# Patient Record
Sex: Male | Born: 1985 | Race: White | Hispanic: No | Marital: Married | Smoking: Never smoker
Health system: Southern US, Community
[De-identification: ages and names within clinical notes are randomized; demographics above are authoritative.]

## PROBLEM LIST (undated history)

## (undated) HISTORY — PX: NO PAST SURGERIES: SHX2092

---
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
## 2007-10-03 ENCOUNTER — Emergency Department: Payer: Self-pay | Admitting: Emergency Medicine

## 2010-02-06 ENCOUNTER — Emergency Department: Payer: Self-pay | Admitting: Emergency Medicine

## 2010-02-08 ENCOUNTER — Ambulatory Visit: Payer: Self-pay | Admitting: Internal Medicine

## 2011-06-30 IMAGING — CR DG CHEST 2V
1 series · 2 of 2 positions shown · non-contrast
Comparison: none

REASON FOR EXAM: chest and back pain with cough and deep breath x 3 weeks.
COMMENTS:

PROCEDURE:     DXR - DXR CHEST PA (OR AP) AND LATERAL  - February 06, 2010 [DATE]
RESULT:     Comparison: None.

[Series 1: view not recorded · 0.17mm/px · 2 of 2 slices shown]
[im 1/2]
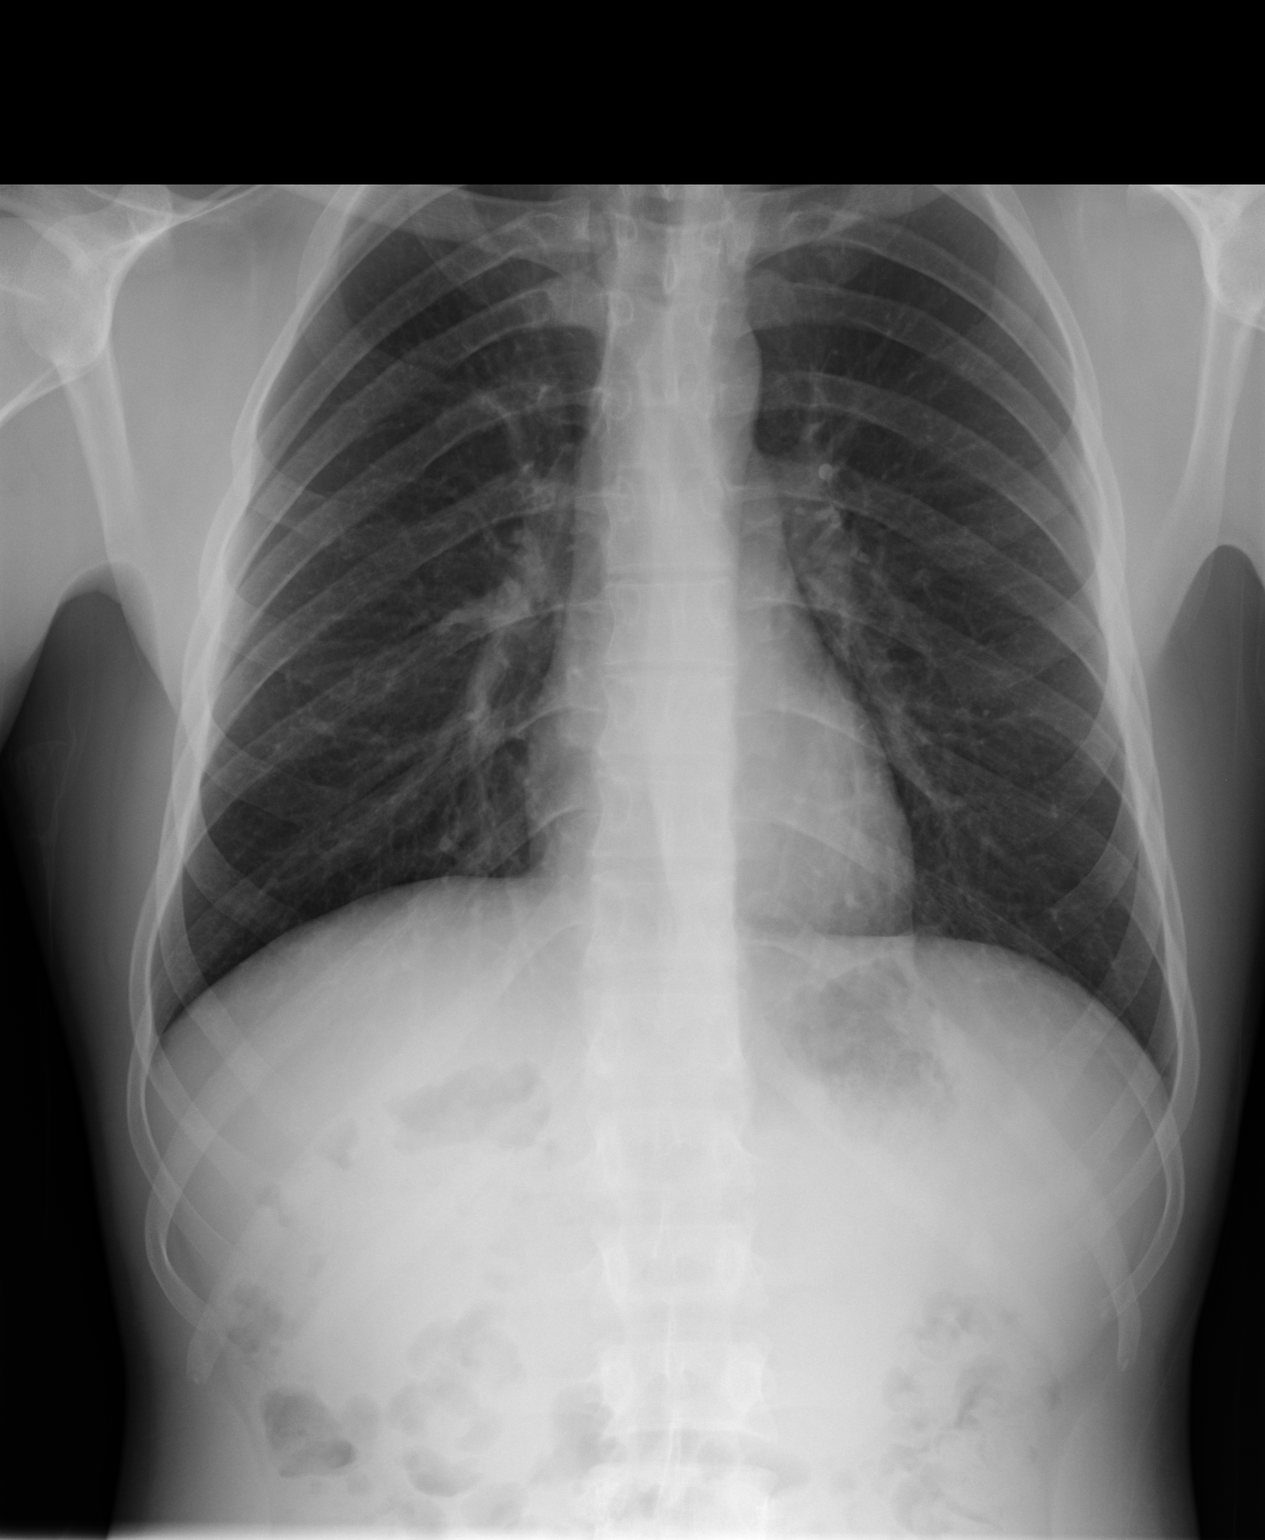
[im 2/2]
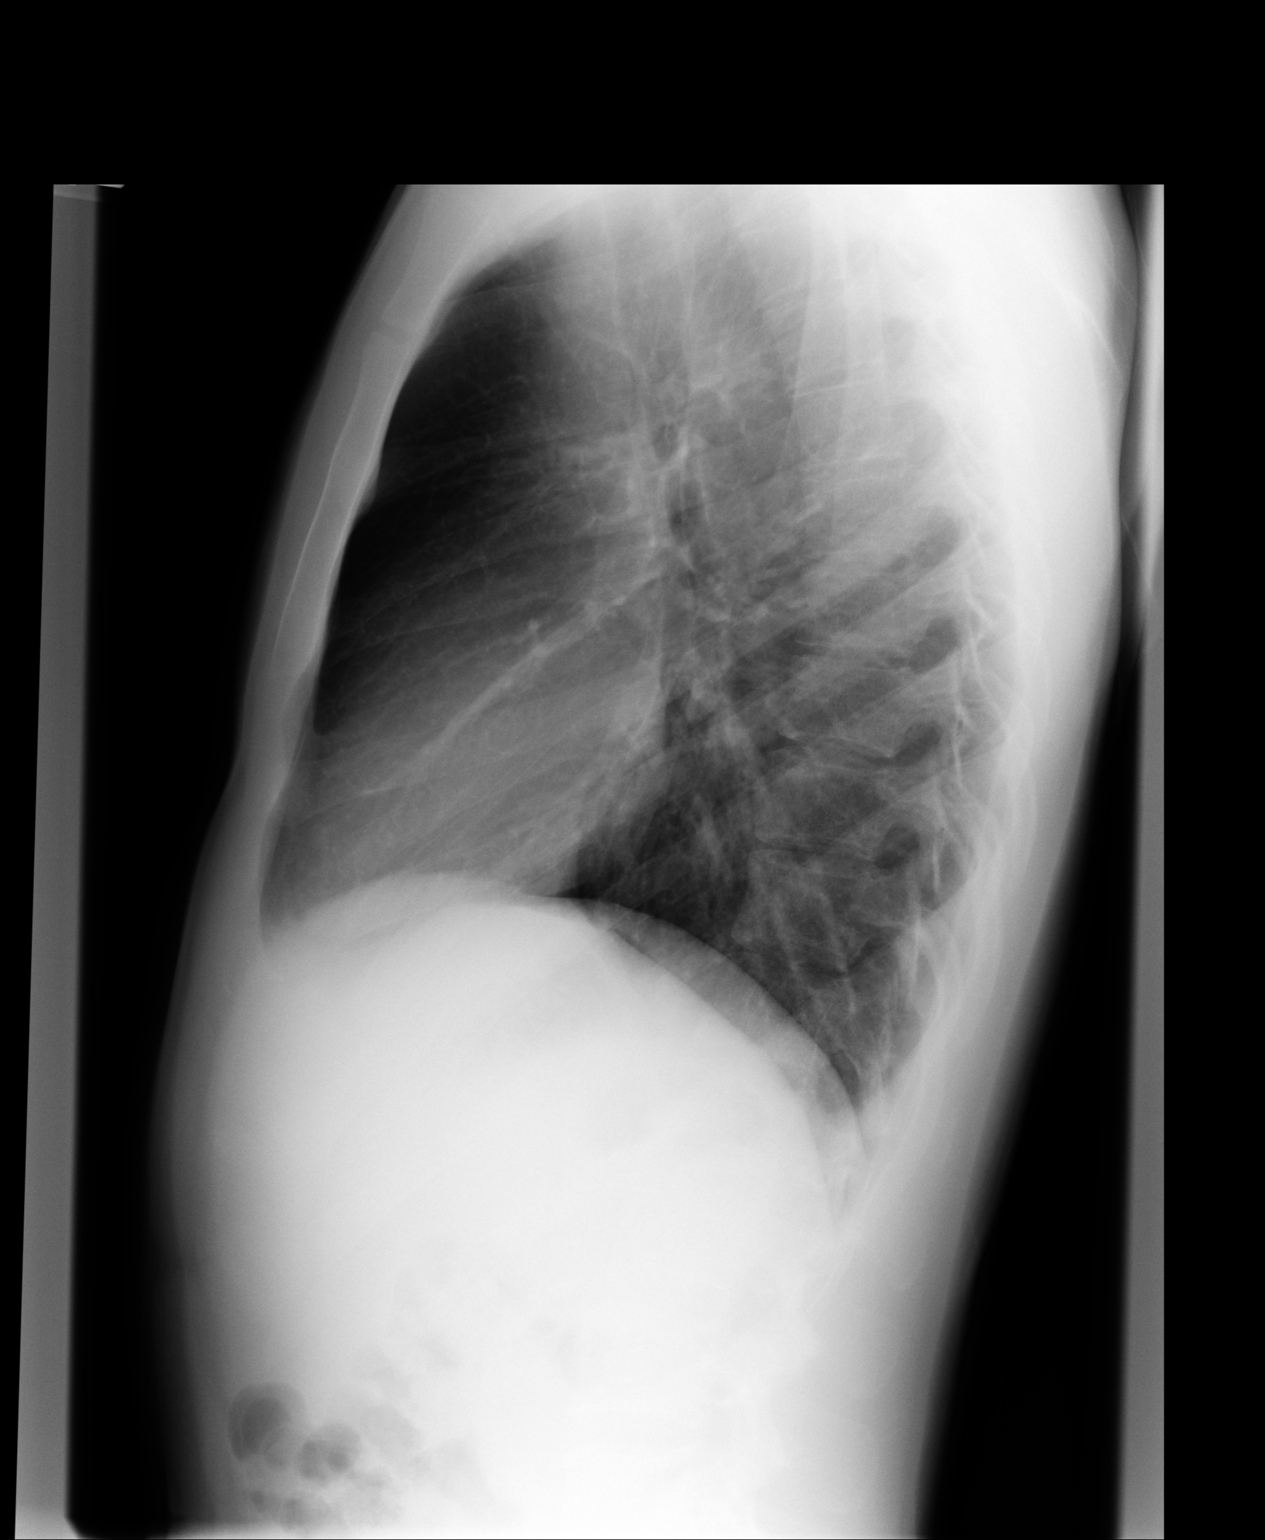

[2 of 2 positions shown; findings below may reference images not displayed]

FINDINGS: Heart and mediastinum are within normal limits. The lungs are clear.
IMPRESSION: No acute cardiopulmonary disease.

## 2019-04-07 ENCOUNTER — Ambulatory Visit: Payer: BLUE CROSS/BLUE SHIELD | Attending: Internal Medicine

## 2019-04-07 DIAGNOSIS — Z20822 Contact with and (suspected) exposure to covid-19: Secondary | ICD-10-CM | POA: Insufficient documentation

## 2019-04-08 LAB — NOVEL CORONAVIRUS, NAA: SARS-CoV-2, NAA: NOT DETECTED

## 2019-08-26 ENCOUNTER — Other Ambulatory Visit: Payer: Self-pay

## 2019-08-26 ENCOUNTER — Ambulatory Visit
Admission: RE | Admit: 2019-08-26 | Discharge: 2019-08-26 | Disposition: A | Discharge status: Home / Self Care | Payer: BLUE CROSS/BLUE SHIELD | Source: Ambulatory Visit | Attending: Nurse Practitioner | Admitting: Nurse Practitioner

## 2019-08-26 ENCOUNTER — Ambulatory Visit: Payer: Self-pay

## 2019-08-26 VITALS — BP 111/73 | HR 61 | Temp 98.5°F | Resp 18 | Ht 67.0 in | Wt 168.0 lb

## 2019-08-26 DIAGNOSIS — Z79899 Other long term (current) drug therapy: Secondary | ICD-10-CM | POA: Insufficient documentation

## 2019-08-26 DIAGNOSIS — R05 Cough: Secondary | ICD-10-CM | POA: Diagnosis present

## 2019-08-26 DIAGNOSIS — Z20822 Contact with and (suspected) exposure to covid-19: Secondary | ICD-10-CM | POA: Diagnosis not present

## 2019-08-26 DIAGNOSIS — J0100 Acute maxillary sinusitis, unspecified: Secondary | ICD-10-CM | POA: Diagnosis not present

## 2019-08-26 MED ORDER — FLUTICASONE PROPIONATE 50 MCG/ACT NA SUSP: 2.0000 | Freq: Every day | NASAL | 1 refills | Status: DC

## 2019-08-26 MED ORDER — DOXYCYCLINE HYCLATE 100 MG PO CAPS: 100.0000 mg | ORAL_CAPSULE | Freq: Two times a day (BID) | ORAL | 0 refills | Status: AC

## 2019-08-26 NOTE — Discharge Instructions (Signed)
Take medications as prescribed. Continue Zyrtec and Advil cold/sinus. Drink plenty of fluids.

## 2019-08-26 NOTE — ED Triage Notes (Signed)
Patient complains of nasal congestion, chest congestion and cough with green productive mucus x Friday. Patient states that he is concerned for a Sinus Infection.

## 2019-08-26 NOTE — ED Provider Notes (Signed)
[facility]    CSN: [id number] Arrival date & time: 08/26/19  1156      History   Chief Complaint Chief Complaint  Patient presents with  . Appointment  . Nasal Congestion    HPI OSEAS DETTY is a 34 y.o. male.   Subjective:   CHRISTIPHER RIEGER is a 34 y.o. male who presents for evaluation of possible sinus infection. Symptoms include congestion, nasal congestion, post nasal drip, cough, and sinus pressure with no fever, chills, night sweats or weight loss. Onset of symptoms was 6 days ago and has been gradually worsening since that time.  He is drinking plenty of fluids.  Past history is significant for no history of pneumonia or bronchitis. Patient is a non-smoker but dips tobacco. He has been completely vaccinated against COVID-19.  He has tried Zyrtec and Advil cold/sinus without much relief in his symptoms.  The following portions of the patient's history were reviewed and updated as appropriate: allergies, current medications, past family history, past medical history, past social history, past surgical history and problem list.          History reviewed. No pertinent past medical history.  There are no problems to display for this patient.   Past Surgical History:  Procedure Laterality Date  . NO PAST SURGERIES         Home Medications    Prior to Admission medications   Medication Sig Start Date End Date Taking? Authorizing Provider  doxycycline (VIBRAMYCIN) 100 MG capsule Take 1 capsule (100 mg total) by mouth 2 (two) times daily for 7 days. 08/26/19 09/02/19  Lurline Idol, FNP  fluticasone (FLONASE) 50 MCG/ACT nasal spray Place 2 sprays into both nostrils daily. 08/26/19   Lurline Idol, FNP    Family History Family History  Problem Relation Age of Onset  . Cancer Mother   . Cirrhosis Father   . Diabetes Father     Social History Social History   Tobacco Use  . Smoking status: Never Smoker  . Smokeless tobacco: Current  User    Types: Chew  Vaping Use  . Vaping Use: Never used  Substance Use Topics  . Alcohol use: Not Currently  . Drug use: Never     Allergies   Amoxicillin   Review of Systems Review of Systems  Constitutional: Negative for fatigue and fever.  HENT: Positive for congestion, rhinorrhea, sinus pressure and sinus pain.   Respiratory: Positive for cough. Negative for shortness of breath.   Gastrointestinal: Negative for nausea and vomiting.  Neurological: Positive for headaches.  All other systems reviewed and are negative.    Physical Exam Triage Vital Signs ED Triage Vitals  Enc Vitals Group     BP 08/26/19 1258 111/73     Pulse Rate 08/26/19 1258 61     Resp 08/26/19 1258 18     Temp 08/26/19 1258 98.5 F (36.9 C)     Temp Source 08/26/19 1258 Oral     SpO2 08/26/19 1258 99 %     Weight 08/26/19 1255 168 lb (76.2 kg)     Height 08/26/19 1255 5\' 7"  (1.702 m)     Head Circumference --      Peak Flow --      Pain Score 08/26/19 1255 0     Pain Loc --      Pain Edu? --      Excl. in GC? --    No data found.  Updated Vital Signs BP 111/73 (  BP Location: Right Arm)   Pulse 61   Temp 98.5 F (36.9 C) (Oral)   Resp 18   Ht 5\' 7"  (1.702 m)   Wt 168 lb (76.2 kg)   SpO2 99%   BMI 26.31 kg/m   Visual Acuity Right Eye Distance:   Left Eye Distance:   Bilateral Distance:    Right Eye Near:   Left Eye Near:    Bilateral Near:     Physical Exam Vitals reviewed.  Constitutional:      General: He is not in acute distress.    Appearance: Normal appearance. He is not ill-appearing, toxic-appearing or diaphoretic.  HENT:     Head: Normocephalic.     Right Ear: Tympanic membrane, ear canal and external ear normal.     Left Ear: Tympanic membrane, ear canal and external ear normal.     Nose: Congestion present.     Right Sinus: Maxillary sinus tenderness present.     Mouth/Throat:     Mouth: Mucous membranes are moist.     Pharynx: Oropharynx is clear.  Eyes:      Extraocular Movements: Extraocular movements intact.     Conjunctiva/sclera: Conjunctivae normal.     Pupils: Pupils are equal, round, and reactive to light.  Cardiovascular:     Rate and Rhythm: Normal rate and regular rhythm.  Pulmonary:     Effort: Pulmonary effort is normal.     Breath sounds: Normal breath sounds.  Musculoskeletal:        General: Normal range of motion.     Cervical back: Normal range of motion and neck supple.  Skin:    General: Skin is warm and dry.  Neurological:     General: No focal deficit present.     Mental Status: He is alert and oriented to person, place, and time.  Psychiatric:        Mood and Affect: Mood normal.        Behavior: Behavior normal.      UC Treatments / Results  Labs (all labs ordered are listed, but only abnormal results are displayed) Labs Reviewed - No data to display  EKG   Radiology No results found.  Procedures Procedures (including critical care time)  Medications Ordered in UC Medications - No data to display  Initial Impression / Assessment and Plan / UC Course  I have reviewed the triage vital signs and the nursing notes.  Pertinent labs & imaging results that were available during my care of the patient were reviewed by me and considered in my medical decision making (see chart for details).    34 year old male presenting with acute maxillary sinusitis.  He is afebrile.  Vital signs stable.  Nontoxic-appearing.  He has been completely vaccinated against COVID-19.  Antibiotics and nasal decongestants as prescribed.  Continue symptomatic measures.  Follow-up with PCP if symptoms worsen or persist.  Today's evaluation has revealed no signs of a dangerous process. Discussed diagnosis with patient and/or guardian. Patient and/or guardian aware of their diagnosis, possible red flag symptoms to watch out for and need for close follow up. Patient and/or guardian understands verbal and written discharge  instructions. Patient and/or guardian comfortable with plan and disposition.  Patient and/or guardian has a clear mental status at this time, good insight into illness (after discussion and teaching) and has clear judgment to make decisions regarding their care  This care was provided during an unprecedented National Emergency due to the Novel Coronavirus (COVID-19) pandemic. COVID-19 infections  and transmission risks place heavy strains on healthcare resources.  As this pandemic evolves, our facility, providers, and staff strive to respond fluidly, to remain operational, and to provide care relative to available resources and information. Outcomes are unpredictable and treatments are without well-defined guidelines. Further, the impact of COVID-19 on all aspects of urgent care, including the impact to patients seeking care for reasons other than COVID-19, is unavoidable during this national emergency. At this time of the global pandemic, management of patients has significantly changed, even for non-COVID positive patients given high local and regional COVID volumes at this time requiring high healthcare system and resource utilization. The standard of care for management of both COVID suspected and non-COVID suspected patients continues to change rapidly at the local, regional, national, and global levels. This patient was worked up and treated to the best available but ever changing evidence and resources available at this current time.   Documentation was completed with the aid of voice recognition software. Transcription may contain typographical errors. Final Clinical Impressions(s) / UC Diagnoses   Final diagnoses:  Acute non-recurrent maxillary sinusitis     Discharge Instructions     Take medications as prescribed. Continue Zyrtec and Advil cold/sinus. Drink plenty of fluids.     ED Prescriptions    Medication Sig Dispense Auth. Provider   doxycycline (VIBRAMYCIN) 100 MG capsule Take 1  capsule (100 mg total) by mouth 2 (two) times daily for 7 days. 14 capsule [place], Aldona Bar, FNP   fluticasone (FLONASE) 50 MCG/ACT nasal spray Place 2 sprays into both nostrils daily. 9.9 mL Enrique Sack, FNP     PDMP not reviewed this encounter.   Enrique Sack, [place] 08/26/19 1328

## 2019-08-27 LAB — SARS CORONAVIRUS 2 (TAT 6-24 HRS): SARS Coronavirus 2: NEGATIVE

## 2019-11-20 ENCOUNTER — Other Ambulatory Visit: Payer: Self-pay

## 2019-11-20 ENCOUNTER — Ambulatory Visit
Admission: RE | Admit: 2019-11-20 | Discharge: 2019-11-20 | Disposition: A | Discharge status: Home / Self Care | Payer: BLUE CROSS/BLUE SHIELD | Source: Ambulatory Visit

## 2021-10-12 ENCOUNTER — Encounter: Payer: Self-pay | Admitting: Emergency Medicine

## 2021-10-12 ENCOUNTER — Ambulatory Visit
Admission: EM | Admit: 2021-10-12 | Discharge: 2021-10-12 | Disposition: A | Discharge status: Home / Self Care | Payer: BLUE CROSS/BLUE SHIELD | Attending: Emergency Medicine | Admitting: Emergency Medicine

## 2021-10-12 DIAGNOSIS — L03213 Periorbital cellulitis: Secondary | ICD-10-CM

## 2021-10-12 MED ORDER — DOXYCYCLINE HYCLATE 100 MG PO CAPS: 100.0000 mg | ORAL_CAPSULE | Freq: Two times a day (BID) | ORAL | 0 refills | Status: DC

## 2021-10-12 NOTE — ED Triage Notes (Addendum)
Pt reports right eye swelling to lower lid that started yesterday. Using OTC sty drops without relief. Reports swelling getting larger. Painful to touch. Denies any blurred or vision impairment or injury to eye.

## 2021-10-12 NOTE — Discharge Instructions (Signed)
Take the Doxycycline twice daily with food for 10 days.  Doxycycline will make you more sensitive to sunburn so wear sunscreen when outdoors and reapply it every 90 minutes.  Apply warm compresses to help promote drainage.  Use OTC Tylenol and Ibuprofen according to the package instructions as needed for pain.  Return for new or worsening symptoms.   

## 2021-10-12 NOTE — ED Provider Notes (Signed)
[facility]    CSN: [id number] Arrival date & time: 10/12/21  1858      History   Chief Complaint Chief Complaint  Patient presents with   Facial Swelling    HPI Justin Duncan is a 36 y.o. male.   HPI  36 year old male here for evaluation of right eye swelling.  Patient reports that he woke up yesterday morning with swelling to his right lower eyelid.  He states that the eyelid is sore to touch but it is not draining.  He denies any changes to vision or recent eye trauma.  History reviewed. No pertinent past medical history.  There are no problems to display for this patient.   Past Surgical History:  Procedure Laterality Date   NO PAST SURGERIES         Home Medications    Prior to Admission medications   Medication Sig Start Date End Date Taking? Authorizing Provider  doxycycline (VIBRAMYCIN) 100 MG capsule Take 1 capsule (100 mg total) by mouth 2 (two) times daily. 10/12/21  Yes Becky Augusta, NP    Family History Family History  Problem Relation Age of Onset   Cancer Mother    Cirrhosis Father    Diabetes Father     Social History Social History   Tobacco Use   Smoking status: Never   Smokeless tobacco: Current    Types: Chew  Vaping Use   Vaping Use: Never used  Substance Use Topics   Alcohol use: Not Currently   Drug use: Never     Allergies   Amoxicillin   Review of Systems Review of Systems  Eyes:  Positive for pain. Negative for photophobia, discharge, redness, itching and visual disturbance.     Physical Exam Triage Vital Signs ED Triage Vitals  Enc Vitals Group     BP 10/12/21 1910 129/84     Pulse Rate 10/12/21 1910 70     Resp 10/12/21 1910 16     Temp 10/12/21 1910 99 F (37.2 C)     Temp src --      SpO2 10/12/21 1910 98 %     Weight --      Height --      Head Circumference --      Peak Flow --      Pain Score 10/12/21 1907 1     Pain Loc --      Pain Edu? --      Excl. in GC? --    No data  found.  Updated Vital Signs BP 129/84 (BP Location: Right Arm)   Pulse 70   Temp 99 F (37.2 C)   Resp 16   SpO2 98%   Visual Acuity Right Eye Distance:   Left Eye Distance:   Bilateral Distance:    Right Eye Near:   Left Eye Near:    Bilateral Near:     Physical Exam Vitals and nursing note reviewed.  Constitutional:      Appearance: Normal appearance. He is not ill-appearing.  HENT:     Head: Normocephalic and atraumatic.  Eyes:     General: No scleral icterus.       Right eye: No discharge.     Extraocular Movements: Extraocular movements intact.     Conjunctiva/sclera: Conjunctivae normal.     Pupils: Pupils are equal, round, and reactive to light.  Skin:    General: Skin is warm and dry.     Capillary Refill: Capillary refill takes less  than 2 seconds.     Findings: No erythema or rash.  Neurological:     General: No focal deficit present.     Mental Status: He is alert and oriented to person, place, and time.  Psychiatric:        Mood and Affect: Mood normal.        Behavior: Behavior normal.        Thought Content: Thought content normal.        Judgment: Judgment normal.      UC Treatments / Results  Labs (all labs ordered are listed, but only abnormal results are displayed) Labs Reviewed - No data to display  EKG   Radiology No results found.  Procedures Procedures (including critical care time)  Medications Ordered in UC Medications - No data to display  Initial Impression / Assessment and Plan / UC Course  I have reviewed the triage vital signs and the nursing notes.  Pertinent labs & imaging results that were available during my care of the patient were reviewed by me and considered in my medical decision making (see chart for details).  Patient is a nontoxic-appearing 36 year old male here for evaluation of swelling to the right lower eyelid that began yesterday morning when he woke up.  Patient denies any changes to his vision or  discharge.  No recent eye trauma.  The swelling is located on the right lower eyelid only and extends down to the orbital rim.  There is mild erythema but no fluctuance or induration.  Patient's pupils equal round reactive and his EOM is intact.  He has a normal red light reflex in the right eye.  Bulbar and labral conjunctiva are unremarkable.  Patient exam is consistent with preseptal cellulitis.  He has an allergy to amoxicillin he states that it causes renal failure.  He has never taken a cephalosporin to his knowledge.  He has been on doxycycline for sinusitis in the past.  I will treat him with doxycycline twice daily for 10 days.  I also advised him by warm compresses to his eye to help improve circulation and resolve the infection.  If the swelling worsens or if he develops changes in vision he needs to follow-up with an eye doctor or return for reevaluation.  Patient verbalizes understanding of same.   Final Clinical Impressions(s) / UC Diagnoses   Final diagnoses:  Preseptal cellulitis of right lower eyelid     Discharge Instructions      Take the Doxycycline twice daily with food for 10 days.  Doxycycline will make you more sensitive to sunburn so wear sunscreen when outdoors and reapply it every 90 minutes.  Apply warm compresses to help promote drainage.  Use OTC Tylenol and Ibuprofen according to the package instructions as needed for pain.  Return for new or worsening symptoms.       ED Prescriptions     Medication Sig Dispense Auth. Provider   doxycycline (VIBRAMYCIN) 100 MG capsule Take 1 capsule (100 mg total) by mouth 2 (two) times daily. 20 capsule Becky Augusta, NP      PDMP not reviewed this encounter.   Becky Augusta, NP 10/12/21 1935

## 2022-01-31 ENCOUNTER — Ambulatory Visit
Admission: EM | Admit: 2022-01-31 | Discharge: 2022-01-31 | Disposition: A | Discharge status: Home / Self Care | Payer: Self-pay | Attending: Family Medicine | Admitting: Family Medicine

## 2022-01-31 ENCOUNTER — Encounter: Payer: Self-pay | Admitting: Emergency Medicine

## 2022-01-31 DIAGNOSIS — H00021 Hordeolum internum right upper eyelid: Secondary | ICD-10-CM

## 2022-01-31 MED ORDER — ERYTHROMYCIN 5 MG/GM OP OINT: TOPICAL_OINTMENT | Freq: Four times a day (QID) | OPHTHALMIC | 0 refills | Status: AC

## 2022-01-31 NOTE — ED Triage Notes (Signed)
Pt presents with right eye lid swelling x  2 days 

## 2022-01-31 NOTE — ED Provider Notes (Signed)
[facility]    CSN: [id number] Arrival date & time: 01/31/22  7096      History   Chief Complaint Chief Complaint  Patient presents with   Belepharitis    HPI HPI  Justin DONAHO is a 36 y.o. male.    Olof presents for right eye pain and swelling that started 2 days ago.  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  Does not feel like anything is in his eye.  Has some mild pain but noticed that it is swollen a little bit more than it was yesterday.  He did notice some drainage from the inside of his upper eyelid.  Has no trouble seeing.  Ruble does not wear glasses or contacts. Verlon has otherwise been well and has no additional concerns today.  Has not had a fever, runny nose, nasal congestion, headache or neck pain.    History reviewed. No pertinent past medical history.  There are no problems to display for this patient.   Past Surgical History:  Procedure Laterality Date   NO PAST SURGERIES         Home Medications    Prior to Admission medications   Medication Sig Start Date End Date Taking? Authorizing Provider  erythromycin ophthalmic ointment Place into the right eye 4 (four) times daily for 7 days. Place a 1/2 inch ribbon of ointment into the lower eyelid. 01/31/22 02/07/22 Yes Katha Cabal, DO    Family History Family History  Problem Relation Age of Onset   Cancer Mother    Cirrhosis Father    Diabetes Father     Social History Social History   Tobacco Use   Smoking status: Never   Smokeless tobacco: Current    Types: Chew  Vaping Use   Vaping Use: Never used  Substance Use Topics   Alcohol use: Not Currently   Drug use: Never     Allergies   Amoxicillin   Review of Systems Review of Systems : negative unless otherwise stated in HPI.      Physical Exam Triage Vital Signs ED Triage Vitals  Enc Vitals Group     BP 01/31/22 0826 127/85     Pulse Rate 01/31/22 0826 69      Resp 01/31/22 0826 16     Temp 01/31/22 0826 98.1 F (36.7 C)     Temp Source 01/31/22 0826 Oral     SpO2 01/31/22 0826 100 %     Weight --      Height --      Head Circumference --      Peak Flow --      Pain Score 01/31/22 0827 2     Pain Loc --      Pain Edu? --      Excl. in GC? --    No data found.  Updated Vital Signs BP 127/85 (BP Location: Right Arm)   Pulse 69   Temp 98.1 F (36.7 C) (Oral)   Resp 16   SpO2 100%   Visual Acuity Right Eye Distance:   Left Eye Distance:   Bilateral Distance:    Right Eye Near:   Left Eye Near:    Bilateral Near:     Physical Exam  GEN: pleasant well appearing male, in no acute distress   NECK: normal ROM  CV: regular rate , well perfused RESP: no increased work of breathing EYES:     General: Lids are  normal. Lids are everted, no foreign bodies appreciated. Vision grossly intact. Gaze aligned appropriately.        Right eye: Upper lid swelling with scant purulent discharge c/w internal hordeolum, no foreign body        Left eye: No foreign body, discharge or hordeolum.     Extraocular Movements: Extraocular movements intact.     Conjunctiva/sclera:     Right eye: no conjunctiva injected. No chemosis or hemorrhage.  SKIN: warm and dry   UC Treatments / Results  Labs (all labs ordered are listed, but only abnormal results are displayed) Labs Reviewed - No data to display  EKG   Radiology No results found.  Procedures Procedures (including critical care time)  Medications Ordered in UC Medications - No data to display  Initial Impression / Assessment and Plan / UC Course  I have reviewed the triage vital signs and the nursing notes.  Pertinent labs & imaging results that were available during my care of the patient were reviewed by me and considered in my medical decision making (see chart for details).     Patient is a 37 y.o. male who presents after acute onset right eye swelling for the past  2 days.  On  exam, he has a internal hordeolum.  Sent erythromycin ointment to the pharmacy.  Advised to follow-up with an ophthalmologist or optometrist, if  discomfort/pain is not improving after 7-day course. Patient voiced understanding.  Discussed MDM, treatment plan and plan for follow-up with patient/parent who agrees with plan.  Final Clinical Impressions(s) / UC Diagnoses   Final diagnoses:  Hordeolum internum of right upper eyelid     Discharge Instructions      Stop by the pharmacy to pick up your prescriptions.  Use the antibiotics 4 times a day for the next 7 days.  You may experience a blurry vision with the ointment but this will resolve for the next 20 to 30 minutes.  If your symptoms continue to persist after treatment please follow-up with the ophthalmologist/optometrist in the local area.      ED Prescriptions     Medication Sig Dispense Auth. Provider   erythromycin ophthalmic ointment Place into the right eye 4 (four) times daily for 7 days. Place a 1/2 inch ribbon of ointment into the lower eyelid. 7 g Katha Cabal, DO      PDMP not reviewed this encounter.   Katha Cabal, DO 01/31/22 3151

## 2022-01-31 NOTE — Discharge Instructions (Addendum)
Stop by the pharmacy to pick up your prescriptions.  Use the antibiotics 4 times a day for the next 7 days.  You may experience a blurry vision with the ointment but this will resolve for the next 20 to 30 minutes.  If your symptoms continue to persist after treatment please follow-up with the ophthalmologist/optometrist in the local area.

## 2022-08-05 ENCOUNTER — Ambulatory Visit
Admission: EM | Admit: 2022-08-05 | Discharge: 2022-08-05 | Disposition: A | Discharge status: Home / Self Care | Payer: Self-pay | Attending: Family Medicine | Admitting: Family Medicine

## 2022-08-05 ENCOUNTER — Encounter: Payer: Self-pay | Admitting: Emergency Medicine

## 2022-08-05 DIAGNOSIS — J320 Chronic maxillary sinusitis: Secondary | ICD-10-CM

## 2022-08-05 MED ORDER — DOXYCYCLINE HYCLATE 100 MG PO TABS: 100.0000 mg | ORAL_TABLET | Freq: Two times a day (BID) | ORAL | 0 refills | Status: DC

## 2022-08-05 NOTE — ED Provider Notes (Signed)
[facility]    CSN: [id number] Arrival date & time: 08/05/22  1255      History   Chief Complaint Chief Complaint  Patient presents with   Sinus Problem    HPI  37 year old male presents for evaluation of the the above.  Patient reports over 1 week history of symptoms.  He reports nasal congestion and sinus pain and pressure.  Discolored nasal discharge.  He is tried over-the-counter cold and sinus medication as well as Flonase without resolution.  No other complaints or concerns at this time.   Home Medications    Prior to Admission medications   Medication Sig Start Date End Date Taking? Authorizing Provider  doxycycline (VIBRA-TABS) 100 MG tablet Take 1 tablet (100 mg total) by mouth 2 (two) times daily. Take with food and a glass of water. 08/05/22  Yes Tommie Sams, DO    Family History Family History  Problem Relation Age of Onset   Cancer Mother    Cirrhosis Father    Diabetes Father     Social History Social History   Tobacco Use   Smoking status: Never   Smokeless tobacco: Current    Types: Chew  Vaping Use   Vaping Use: Never used  Substance Use Topics   Alcohol use: Not Currently   Drug use: Never     Allergies   Amoxicillin   Review of Systems Review of Systems Per HPI  Physical Exam Triage Vital Signs ED Triage Vitals  Enc Vitals Group     BP 08/05/22 1303 132/82     Pulse Rate 08/05/22 1303 67     Resp 08/05/22 1303 15     Temp 08/05/22 1303 99 F (37.2 C)     Temp Source 08/05/22 1303 Oral     SpO2 08/05/22 1303 96 %     Weight 08/05/22 1302 170 lb (77.1 kg)     Height 08/05/22 1302 5\' 7"  (1.702 m)     Head Circumference --      Peak Flow --      Pain Score 08/05/22 1301 4     Pain Loc --      Pain Edu? --      Excl. in GC? --    Updated Vital Signs BP 132/82 (BP Location: Right Arm)   Pulse 67   Temp 99 F (37.2 C) (Oral)   Resp 15   Ht 5\' 7"  (1.702 m)   Wt 77.1 kg   SpO2 96%   BMI 26.63 kg/m    Visual Acuity Right Eye Distance:   Left Eye Distance:   Bilateral Distance:    Right Eye Near:   Left Eye Near:    Bilateral Near:     Physical Exam Vitals and nursing note reviewed.  Constitutional:      General: He is not in acute distress.    Appearance: Normal appearance.  HENT:     Head: Normocephalic and atraumatic.     Right Ear: Tympanic membrane normal.     Left Ear: Tympanic membrane normal.     Nose: Congestion present.     Mouth/Throat:     Pharynx: Oropharynx is clear.  Eyes:     Conjunctiva/sclera: Conjunctivae normal.  Cardiovascular:     Rate and Rhythm: Normal rate and regular rhythm.  Pulmonary:     Effort: Pulmonary effort is normal.     Breath sounds: Normal breath sounds. No wheezing, rhonchi or rales.  Neurological:  Mental Status: He is alert.      UC Treatments / Results  Labs (all labs ordered are listed, but only abnormal results are displayed) Labs Reviewed - No data to display  EKG   Radiology No results found.  Procedures Procedures (including critical care time)  Medications Ordered in UC Medications - No data to display  Initial Impression / Assessment and Plan / UC Course  I have reviewed the triage vital signs and the nursing notes.  Pertinent labs & imaging results that were available during my care of the patient were reviewed by me and considered in my medical decision making (see chart for details).    37 year old male presents with maxillary sinusitis.  Treating with doxycycline given penicillin allergy.  Final Clinical Impressions(s) / UC Diagnoses   Final diagnoses:  Right maxillary sinusitis   Discharge Instructions   None    ED Prescriptions     Medication Sig Dispense Auth. Provider   doxycycline (VIBRA-TABS) 100 MG tablet Take 1 tablet (100 mg total) by mouth 2 (two) times daily. Take with food and a glass of water. 14 tablet Tommie Sams, DO      PDMP not reviewed this encounter.   Tommie Sams, [place] 08/05/22 1348

## 2022-08-05 NOTE — ED Triage Notes (Signed)
Patient c/o sinus congestion and pressure, and nasal congestion for over a week.  Patient denies fevers.

## 2022-08-10 ENCOUNTER — Telehealth: Payer: Self-pay | Admitting: Emergency Medicine

## 2022-08-10 MED ORDER — AZITHROMYCIN 250 MG PO TABS: 250.0000 mg | ORAL_TABLET | Freq: Every day | ORAL | 0 refills | Status: DC

## 2022-08-10 NOTE — Telephone Encounter (Signed)
Patient seen 5 days ago in this urgent care, diagnosed with sinus infection, started on doxycycline, wife endorses that he has begun to experience ulcerations to the lips and mouth, concern for medical action.  Per chart review patient has taken doxycycline in the past without complication several times, also ulcerations or not a adverse effect, relayed this message to the wife, still would like to move forward with medication change, azithromycin prescribed, will need in person evaluation for any concerns regarding mouth ulcerations, verbalized understanding

## 2023-01-24 ENCOUNTER — Ambulatory Visit
Admission: EM | Admit: 2023-01-24 | Discharge: 2023-01-24 | Disposition: A | Discharge status: Home / Self Care | Payer: Self-pay | Attending: Family Medicine | Admitting: Family Medicine

## 2023-01-24 DIAGNOSIS — Z1152 Encounter for screening for COVID-19: Secondary | ICD-10-CM | POA: Insufficient documentation

## 2023-01-24 DIAGNOSIS — J111 Influenza due to unidentified influenza virus with other respiratory manifestations: Secondary | ICD-10-CM

## 2023-01-24 DIAGNOSIS — J069 Acute upper respiratory infection, unspecified: Secondary | ICD-10-CM

## 2023-01-24 LAB — GROUP A STREP BY PCR: Group A Strep by PCR: NOT DETECTED

## 2023-01-24 LAB — SARS CORONAVIRUS 2 BY RT PCR: SARS Coronavirus 2 by RT PCR: NEGATIVE

## 2023-01-24 MED ORDER — PROMETHAZINE-DM 6.25-15 MG/5ML PO SYRP: 5.0000 mL | ORAL_SOLUTION | Freq: Four times a day (QID) | ORAL | 0 refills | Status: AC | PRN

## 2023-01-24 MED ORDER — ONDANSETRON 4 MG PO TBDP: 4.0000 mg | ORAL_TABLET | Freq: Three times a day (TID) | ORAL | 0 refills | Status: AC | PRN

## 2023-01-24 MED ORDER — BENZONATATE 100 MG PO CAPS: 100.0000 mg | ORAL_CAPSULE | Freq: Three times a day (TID) | ORAL | 0 refills | Status: AC

## 2023-01-24 NOTE — ED Triage Notes (Signed)
Pt c/o cough,sinus pressure & facial pain x4 days. Has tried robitussin & cough syrup w/o relief.

## 2023-01-24 NOTE — ED Provider Notes (Signed)
[facility]    CSN: [id number] Arrival date & time: 01/24/23  1100      History   Chief Complaint Chief Complaint  Patient presents with   Sore Throat   Cough   Facial Pain    HPI Justin Duncan is a 37 y.o. male.   HPI  History obtained from the patient. Kunal presents for sore throat, cough and facial pain that started on Sunday.  Has been vomiting with diarrhea which started yesterday.  No known sick contacts.  Took Advil cold and Sinus and Robitussin but its not staying down.   No known fever but had chills especially at night.      History reviewed. No pertinent past medical history.  There are no problems to display for this patient.   Past Surgical History:  Procedure Laterality Date   NO PAST SURGERIES         Home Medications    Prior to Admission medications   Medication Sig Start Date End Date Taking? Authorizing Provider  benzonatate (TESSALON) 100 MG capsule Take 1 capsule (100 mg total) by mouth every 8 (eight) hours. 01/24/23  Yes Idonia Zollinger, DO  ondansetron (ZOFRAN-ODT) 4 MG disintegrating tablet Take 1 tablet (4 mg total) by mouth every 8 (eight) hours as needed. 01/24/23  Yes Janki Dike, DO  promethazine-dextromethorphan (PROMETHAZINE-DM) 6.25-15 MG/5ML syrup Take 5 mLs by mouth 4 (four) times daily as needed. 01/24/23  Yes Katha Cabal, DO    Family History Family History  Problem Relation Age of Onset   Cancer Mother    Cirrhosis Father    Diabetes Father     Social History Social History   Tobacco Use   Smoking status: Never   Smokeless tobacco: Current    Types: Chew  Vaping Use   Vaping status: Never Used  Substance Use Topics   Alcohol use: Not Currently   Drug use: Never     Allergies   Doxycycline and Amoxicillin   Review of Systems Review of Systems: negative unless otherwise stated in HPI.      Physical Exam Triage Vital Signs ED Triage Vitals  Encounter Vitals Group     BP  01/24/23 1135 130/86     Systolic BP Percentile --      Diastolic BP Percentile --      Pulse Rate 01/24/23 1135 78     Resp 01/24/23 1135 16     Temp 01/24/23 1135 99.9 F (37.7 C)     Temp Source 01/24/23 1135 Oral     SpO2 01/24/23 1135 97 %     Weight 01/24/23 1134 170 lb (77.1 kg)     Height 01/24/23 1134 5\' 7"  (1.702 m)     Head Circumference --      Peak Flow --      Pain Score 01/24/23 1139 10     Pain Loc --      Pain Education --      Exclude from Growth Chart --    No data found.  Updated Vital Signs BP 130/86 (BP Location: Left Arm)   Pulse 78   Temp 99.9 F (37.7 C) (Oral)   Resp 16   Ht 5\' 7"  (1.702 m)   Wt 77.1 kg   SpO2 97%   BMI 26.63 kg/m   Visual Acuity Right Eye Distance:   Left Eye Distance:   Bilateral Distance:    Right Eye Near:   Left Eye Near:    Bilateral  Near:     Physical Exam GEN:     alert, ill but non-toxic appearing male in no distress    HENT:  mucus membranes moist, oropharyngeal without lesions, mild erythema, no tonsillar hypertrophy or exudates, clear nasal discharge EYES:   pupils equal and reactive, no scleral injection or discharge NECK:  normal ROM, no meningismus   RESP:  no increased work of breathing, clear to auscultation bilaterally CVS:   regular rate and rhythm Skin:   warm and dry    UC Treatments / Results  Labs (all labs ordered are listed, but only abnormal results are displayed) Labs Reviewed  SARS CORONAVIRUS 2 BY RT PCR  GROUP A STREP BY PCR    EKG   Radiology No results found.  Procedures Procedures (including critical care time)  Medications Ordered in UC Medications - No data to display  Initial Impression / Assessment and Plan / UC Course  I have reviewed the triage vital signs and the nursing notes.  Pertinent labs & imaging results that were available during my care of the patient were reviewed by me and considered in my medical decision making (see chart for details).        Pt is a 37 y.o. male who presents for 3 days of influenza like symptoms. Draysen is afebrile here without recent antipyretics.  Offered Zofran here but he declined.  Satting well on room air. Overall pt is ill but non-toxic appearing, well hydrated, without respiratory distress. Pulmonary exam is unremarkable.  Strep PCR is negative. COVID testing obtained and are negative.   History consistent with viral  illness. Discussed symptomatic treatment.  Explained lack of efficacy of antibiotics in viral disease.  Typical duration of symptoms discussed. If pt actually has the flu, he is outside the window for good response to Tamiflu.   Treat cough with Tessalon Perles and Promethazine DM.  Zofran for nausea and vomiting\.   Return and ED precautions given and voiced understanding. Discussed MDM, treatment plan and plan for follow-up with patient who agrees with plan.     Final Clinical Impressions(s) / UC Diagnoses   Final diagnoses:  Influenza-like illness  Viral URI with cough     Discharge Instructions      Your COVID and strep tests were negative. You have the flu.  Stop by the pharmacy to pick up your prescriptions.  Follow up with your primary care provider as needed.   You can take Tylenol and/or Ibuprofen as needed for fever reduction and pain relief.    For cough: honey 1/2 to 1 teaspoon (you can dilute the honey in water or another fluid).  You can also use guaifenesin and dextromethorphan for cough. You can use a humidifier for chest congestion and cough.  If you don't have a humidifier, you can sit in the bathroom with the hot shower running.      For sore throat: try warm salt water gargles, Mucinex sore throat cough drops or cepacol lozenges, throat spray, warm tea or water with lemon/honey, popsicles or ice, or OTC cold relief medicine for throat discomfort. You can also purchase chloraseptic spray at the pharmacy or dollar store.   For congestion: take a daily  anti-histamine like Zyrtec, Claritin, and a oral decongestant, such as pseudoephedrine.  You can also use Flonase 1-2 sprays in each nostril daily. Afrin is also a good option, if you do not have high blood pressure.    It is important to stay hydrated: drink plenty of  fluids (water, gatorade/powerade/pedialyte, juices, or teas) to keep your throat moisturized and help further relieve irritation/discomfort.    Return or go to the Emergency Department if symptoms worsen or do not improve in the next few days      ED Prescriptions     Medication Sig Dispense Auth. Provider   promethazine-dextromethorphan (PROMETHAZINE-DM) 6.25-15 MG/5ML syrup Take 5 mLs by mouth 4 (four) times daily as needed. 118 mL Lekisha Mcghee, DO   benzonatate (TESSALON) 100 MG capsule Take 1 capsule (100 mg total) by mouth every 8 (eight) hours. 21 capsule Teyanna Thielman, DO   ondansetron (ZOFRAN-ODT) 4 MG disintegrating tablet Take 1 tablet (4 mg total) by mouth every 8 (eight) hours as needed. 20 tablet Katha Cabal, DO      PDMP not reviewed this encounter.   Katha Cabal, DO 01/24/23 1258

## 2023-01-24 NOTE — Discharge Instructions (Addendum)
Your COVID and strep tests were negative. You have the flu.  Stop by the pharmacy to pick up your prescriptions.  Follow up with your primary care provider as needed.   You can take Tylenol and/or Ibuprofen as needed for fever reduction and pain relief.    For cough: honey 1/2 to 1 teaspoon (you can dilute the honey in water or another fluid).  You can also use guaifenesin and dextromethorphan for cough. You can use a humidifier for chest congestion and cough.  If you don't have a humidifier, you can sit in the bathroom with the hot shower running.      For sore throat: try warm salt water gargles, Mucinex sore throat cough drops or cepacol lozenges, throat spray, warm tea or water with lemon/honey, popsicles or ice, or OTC cold relief medicine for throat discomfort. You can also purchase chloraseptic spray at the pharmacy or dollar store.   For congestion: take a daily anti-histamine like Zyrtec, Claritin, and a oral decongestant, such as pseudoephedrine.  You can also use Flonase 1-2 sprays in each nostril daily. Afrin is also a good option, if you do not have high blood pressure.    It is important to stay hydrated: drink plenty of fluids (water, gatorade/powerade/pedialyte, juices, or teas) to keep your throat moisturized and help further relieve irritation/discomfort.    Return or go to the Emergency Department if symptoms worsen or do not improve in the next few days
